# Patient Record
Sex: Female | Born: 1970 | Race: Black or African American | Hispanic: No | Marital: Single
Health system: Southern US, Community
[De-identification: ages and names within clinical notes are randomized; demographics above are authoritative.]

## PROBLEM LIST (undated history)

## (undated) HISTORY — PX: CEREBRAL ANEURYSM REPAIR: SHX164

---

## 1998-07-19 ENCOUNTER — Ambulatory Visit (HOSPITAL_COMMUNITY): Admission: RE | Admit: 1998-07-19 | Discharge: 1998-07-19 | Payer: Self-pay | Admitting: Unknown Physician Specialty

## 1998-07-21 ENCOUNTER — Ambulatory Visit (HOSPITAL_COMMUNITY): Admission: RE | Admit: 1998-07-21 | Discharge: 1998-07-21 | Payer: Self-pay | Admitting: Unknown Physician Specialty

## 1998-08-25 ENCOUNTER — Emergency Department (HOSPITAL_COMMUNITY): Admission: EM | Admit: 1998-08-25 | Discharge: 1998-08-25 | Payer: Self-pay | Admitting: Emergency Medicine

## 1999-10-18 ENCOUNTER — Emergency Department (HOSPITAL_COMMUNITY): Admission: EM | Admit: 1999-10-18 | Discharge: 1999-10-18 | Payer: Self-pay | Admitting: Emergency Medicine

## 1999-10-25 ENCOUNTER — Emergency Department (HOSPITAL_COMMUNITY): Admission: EM | Admit: 1999-10-25 | Discharge: 1999-10-25 | Payer: Self-pay

## 1999-10-26 ENCOUNTER — Emergency Department (HOSPITAL_COMMUNITY): Admission: EM | Admit: 1999-10-26 | Discharge: 1999-10-26 | Payer: Self-pay | Admitting: Emergency Medicine

## 1999-10-29 ENCOUNTER — Inpatient Hospital Stay (HOSPITAL_COMMUNITY): Admission: EM | Admit: 1999-10-29 | Discharge: 1999-10-31 | Payer: Self-pay | Admitting: Emergency Medicine

## 1999-10-30 ENCOUNTER — Encounter: Payer: Self-pay | Admitting: Neurology

## 1999-11-04 ENCOUNTER — Encounter: Payer: Self-pay | Admitting: Emergency Medicine

## 1999-11-04 ENCOUNTER — Inpatient Hospital Stay (HOSPITAL_COMMUNITY): Admission: EM | Admit: 1999-11-04 | Discharge: 1999-11-21 | Payer: Self-pay | Admitting: Neurosurgery

## 1999-11-04 ENCOUNTER — Encounter: Payer: Self-pay | Admitting: Neurosurgery

## 1999-11-05 ENCOUNTER — Encounter: Payer: Self-pay | Admitting: Neurosurgery

## 1999-11-07 ENCOUNTER — Encounter: Payer: Self-pay | Admitting: Neurological Surgery

## 1999-11-09 ENCOUNTER — Encounter: Payer: Self-pay | Admitting: Neurological Surgery

## 1999-11-18 ENCOUNTER — Encounter: Payer: Self-pay | Admitting: Neurosurgery

## 2000-02-05 ENCOUNTER — Encounter: Admission: RE | Admit: 2000-02-05 | Discharge: 2000-02-05 | Payer: Self-pay | Admitting: Neurosurgery

## 2000-02-05 ENCOUNTER — Encounter: Payer: Self-pay | Admitting: Neurosurgery

## 2000-10-17 ENCOUNTER — Emergency Department (HOSPITAL_COMMUNITY): Admission: EM | Admit: 2000-10-17 | Discharge: 2000-10-17 | Payer: Self-pay | Admitting: *Deleted

## 2000-10-17 ENCOUNTER — Encounter: Payer: Self-pay | Admitting: *Deleted

## 2001-04-24 ENCOUNTER — Emergency Department (HOSPITAL_COMMUNITY): Admission: EM | Admit: 2001-04-24 | Discharge: 2001-04-24 | Payer: Self-pay

## 2001-04-24 ENCOUNTER — Encounter: Payer: Self-pay | Admitting: Emergency Medicine

## 2003-05-31 ENCOUNTER — Emergency Department (HOSPITAL_COMMUNITY): Admission: EM | Admit: 2003-05-31 | Discharge: 2003-05-31 | Payer: Self-pay | Admitting: *Deleted

## 2003-06-04 ENCOUNTER — Encounter: Admission: RE | Admit: 2003-06-04 | Discharge: 2003-06-04 | Payer: Self-pay | Admitting: General Practice

## 2003-06-04 ENCOUNTER — Encounter: Payer: Self-pay | Admitting: General Practice

## 2005-05-04 ENCOUNTER — Emergency Department (HOSPITAL_COMMUNITY): Admission: EM | Admit: 2005-05-04 | Discharge: 2005-05-04 | Payer: Self-pay | Admitting: Emergency Medicine

## 2006-02-08 ENCOUNTER — Emergency Department (HOSPITAL_COMMUNITY): Admission: EM | Admit: 2006-02-08 | Discharge: 2006-02-09 | Payer: Self-pay | Admitting: Emergency Medicine

## 2008-02-24 ENCOUNTER — Emergency Department (HOSPITAL_COMMUNITY): Admission: EM | Admit: 2008-02-24 | Discharge: 2008-02-24 | Payer: Self-pay | Admitting: Emergency Medicine

## 2009-08-29 IMAGING — CT CT HEAD W/O CM
1 series · 16 of 30 positions shown, 20 images · non-contrast
Comparison: None available

CLINICAL DATA: Extreme dizziness.  History of subarachnoid
hemorrhage with aneurysm clipping.

CT HEAD WITHOUT CONTRAST
TECHNIQUE: Contiguous axial images were obtained from the base of
the skull through the vertex without contrast.

[Series 2: headseq 4.8 h45s · axial · 0.43mm/px · z∈[-201,-68]mm · 16 of 30 slices shown, 20 images]
[im 2/30  brain]
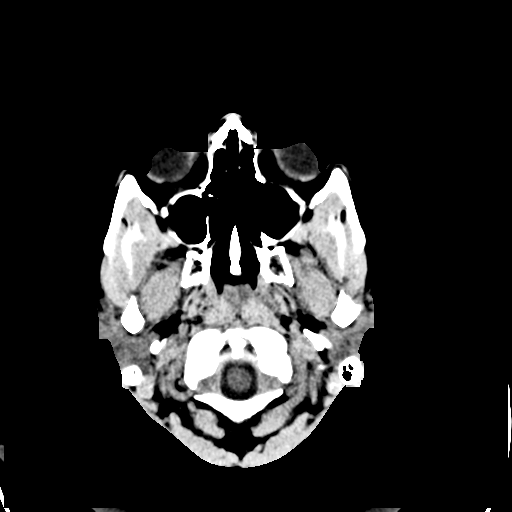
[im 2/30  bone]
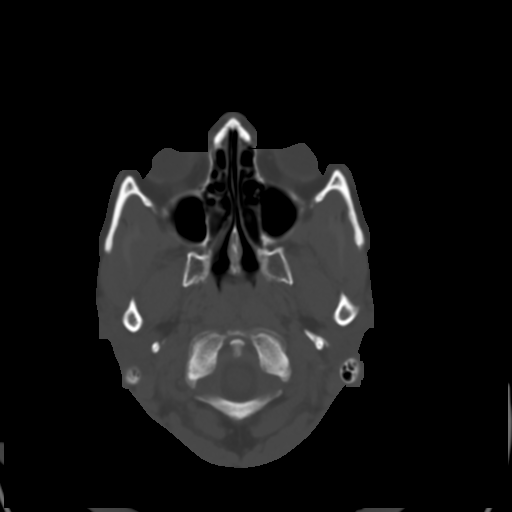
[im 4/30  brain]
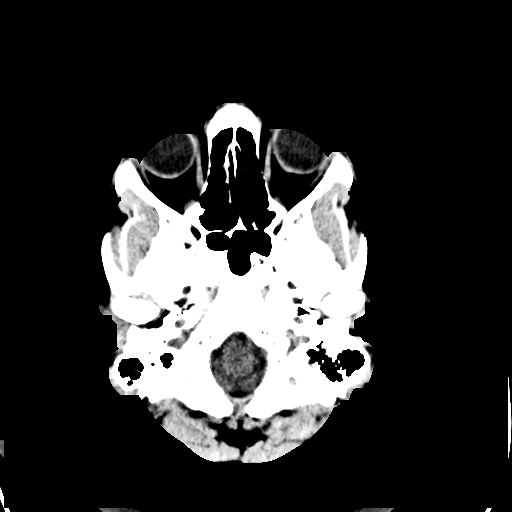
[im 6/30  brain]
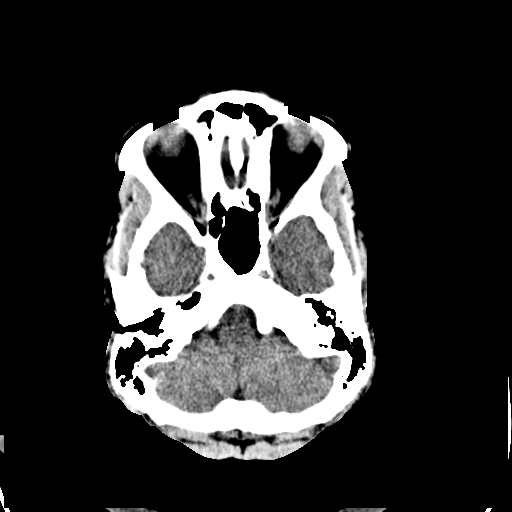
[im 8/30  brain]
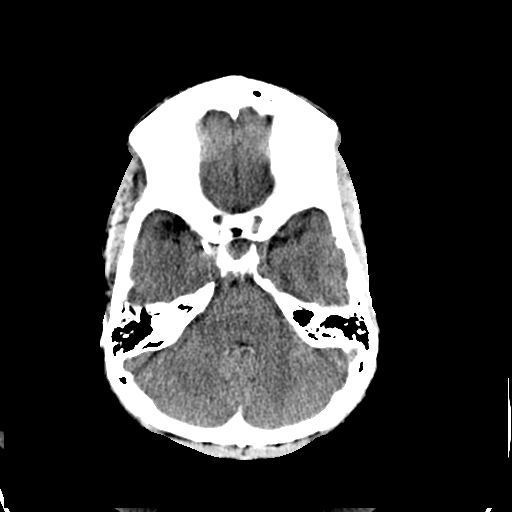
[im 9/30  brain]
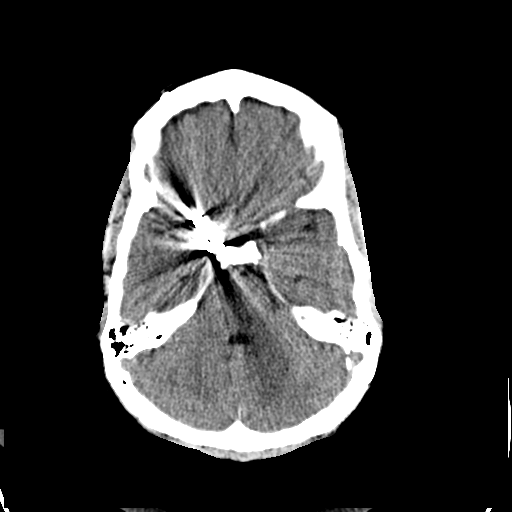
[im 9/30  bone]
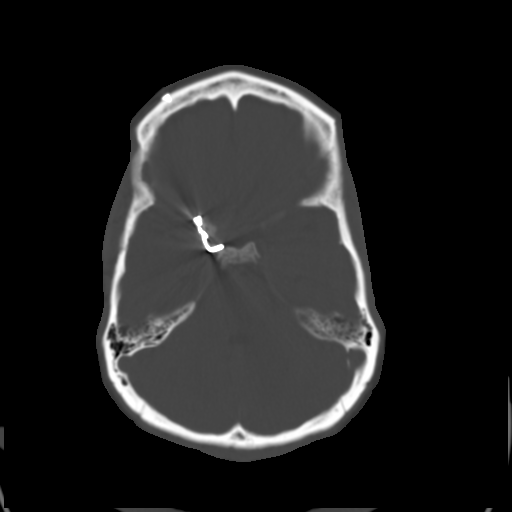
[im 11/30  brain]
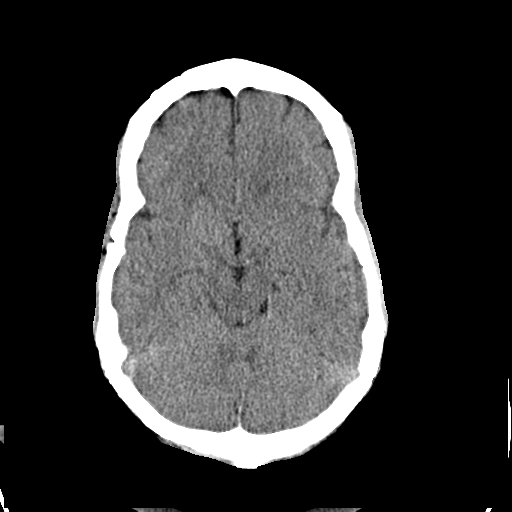
[im 13/30  brain]
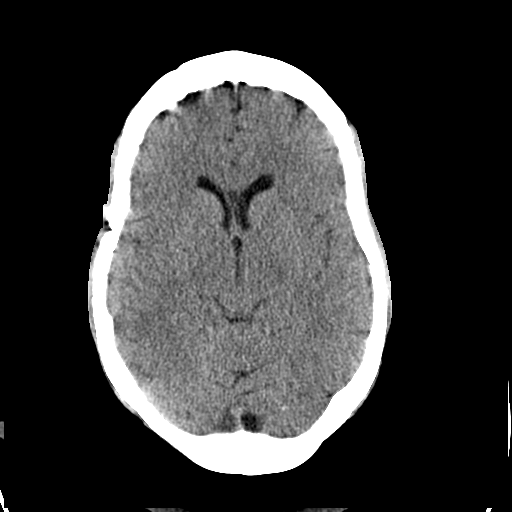
[im 15/30  brain]
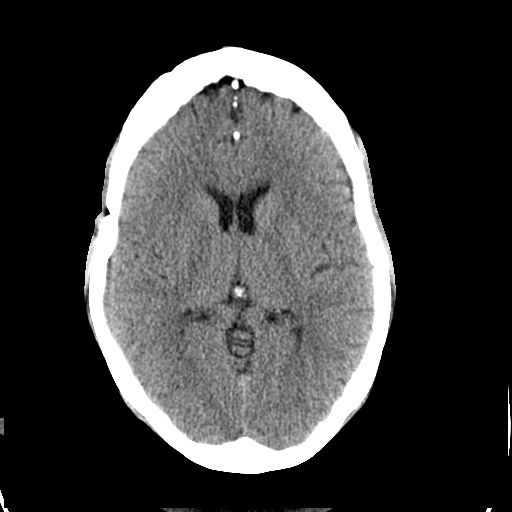
[im 16/30  brain]
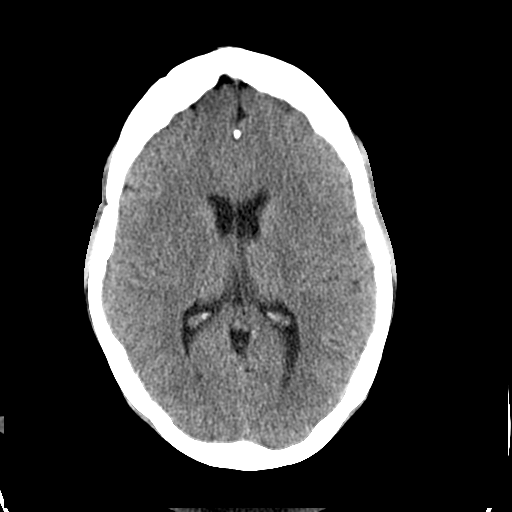
[im 16/30  bone]
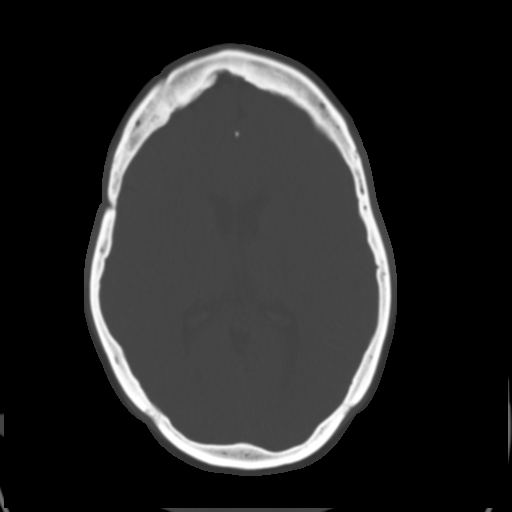
[im 18/30  brain]
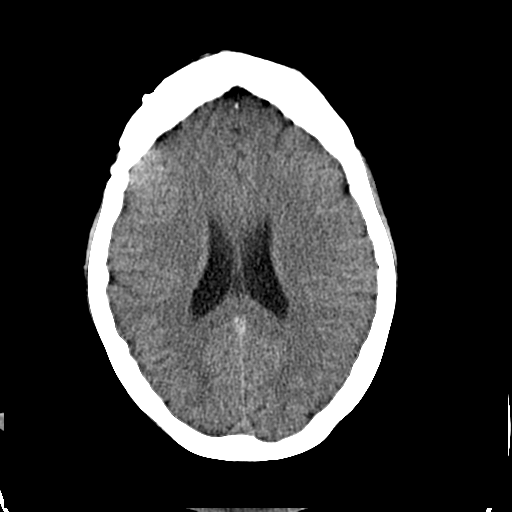
[im 20/30  brain]
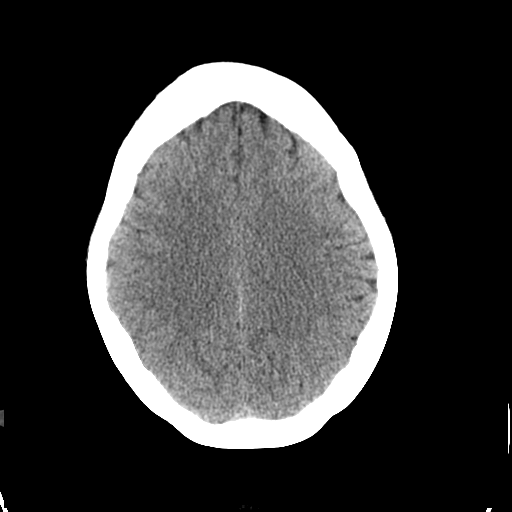
[im 22/30  brain]
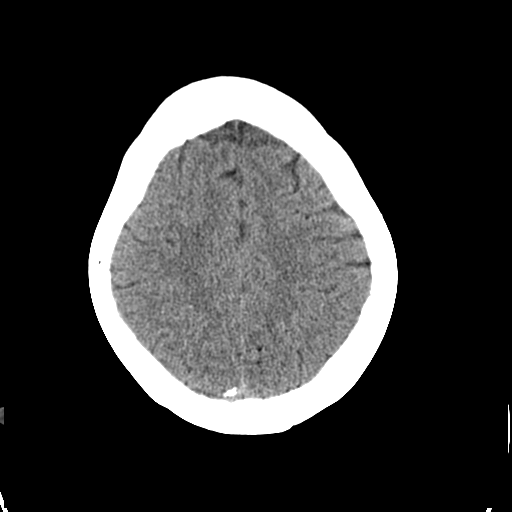
[im 23/30  brain]
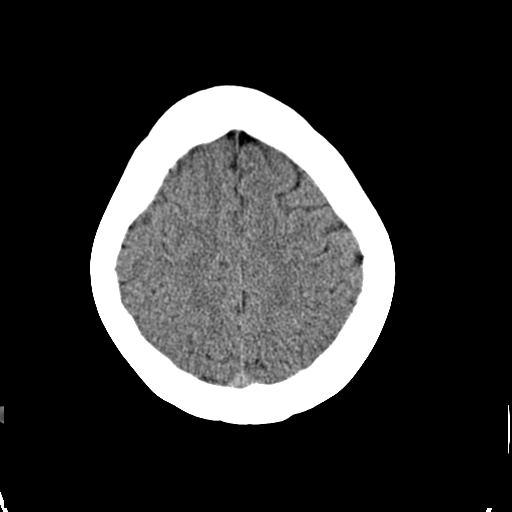
[im 23/30  bone]
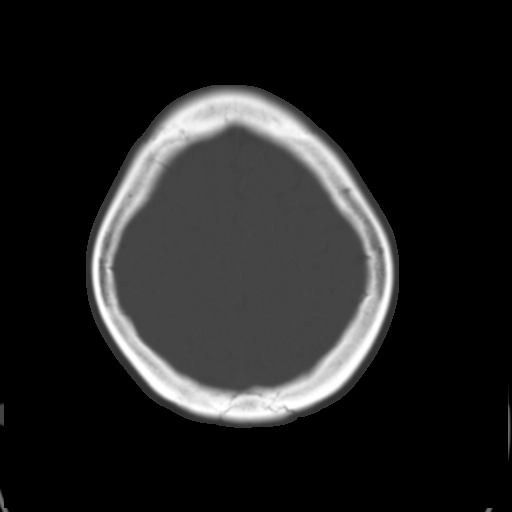
[im 25/30  brain]
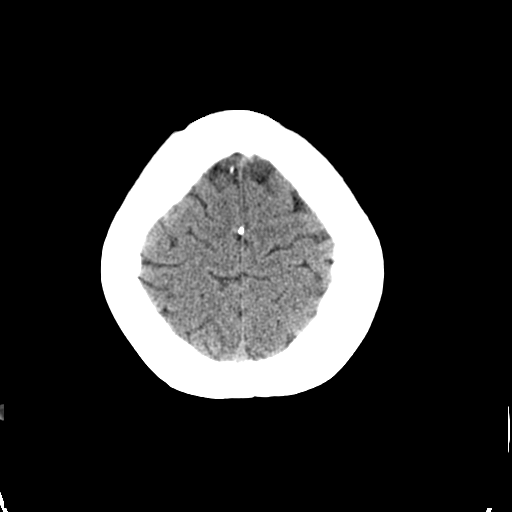
[im 27/30  brain]
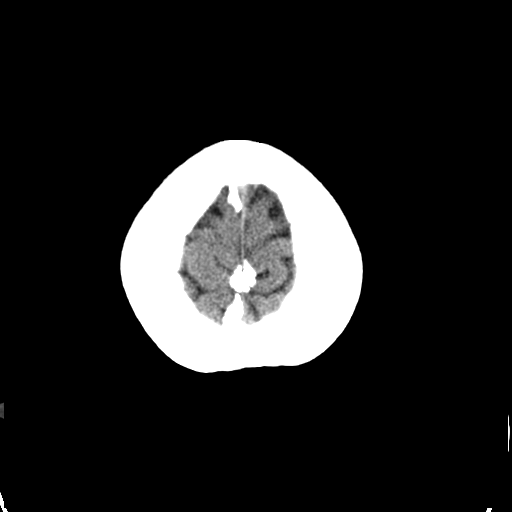
[im 29/30  brain]
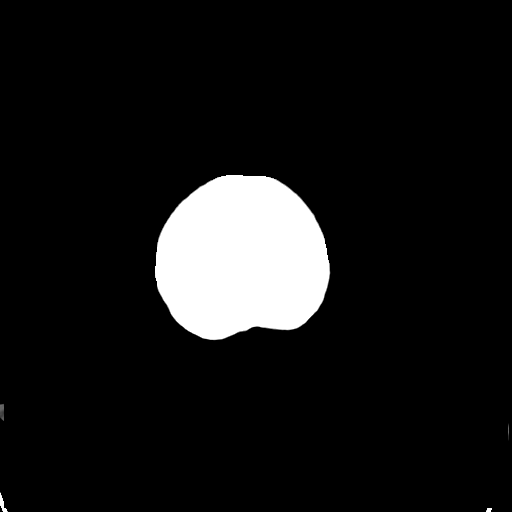

[16 of 30 positions shown; findings below may reference images not displayed]

FINDINGS: There is no evidence for acute hemorrhage, hydrocephalus,
mass lesion, or abnormal extra-axial fluid collection.  No definite
CT evidence for acute infarct.  Aneurysm clip seen in the right MCA
region.  Visualized portions of the paranasal sinuses and mastoid
air cells are clear.  Right fronto temporal craniotomy defect is
evident.
IMPRESSION: Postsurgical change on the right.  No acute intracranial
abnormality.

## 2011-02-26 NOTE — H&P (Signed)
[place]  Patient:    Gina Hunter                         MRN: [id number] Adm. Date:  [id number] Attending:  Josie Saunders CC:         Marlan Palau, M.D.                         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  HISTORY OF ILLNESS:  Gina Hunter is a 40 year old woman who has been having intermittent severe headaches since October 13, 1999, who was treated for migraines by Dr. Marlan Palau after initial head CTs were negative at [facility] x 2 and she had an MRI of her brain which was also negative. The patient subsequently developed severe headache with nausea, vomiting and enlarged right pupil and presented to [facility] on November 04, 1999 where a head CT demonstrated right-sided subarachnoid hemorrhage with incipient hydrocephalus.  The patient was found to have right third nerve palsy on examination.  She was seen by Dr. Anne Hahn, who called me; the patient was subsequently transferred to [facility] for further management.  An angiogram was done on an emergent basis at [facility] which shows a large right posterior communicating artery aneurysm with mild vasospasm.  Head CT showed incipient hydrocephalus.  PHYSICAL EXAMINATION:  NEUROLOGIC:  On initial examination in [facility], the patient was somnolent but arousable.  She speaks with a very weak voice.  She has a complete right third nerve palsy with ptosis, large pupil, no abduction and decreased up-gaze and down-gaze.  Her left pupil is reactive.  Extraocular movements are intact on the left.  She was stable to state her name and that she was in the hospital.  She complained of a headache.  She had meningismus.  She moved all extremities to command.  ASSESSMENT AND PLAN:  The patient was taken  following angiography for emergent craniotomy, clipping of aneurysm and placement of right intraventricular catheter. The patient was informed of the situation as well as the attendant risks of surgery which include vasospasm, hydrocephalus, stroke, death, need for shunt or intraoperative rupture of the aneurysm.  PAST MEDICAL HISTORY:  Essentially unremarkable other than headaches.  SOCIAL HISTORY:  Patient works as a Geophysicist/field seismologist.  She has a 12-year-old daughter.  ALLERGIES:  She is intolerant of CODEINE, which causes nausea. DD:  11/05/99 TD:  11/05/99 Job: [id number]

## 2011-02-26 NOTE — Discharge Summary (Signed)
[place] [place]  Patient:    Gina Hunter, Gina Hunter                        MRN: [id number] Adm. Date:  [id number] Disc. Date: [id number] Attending:  Josie Saunders Dictator:   Cristi Loron, M.D.                           Discharge Summary  HISTORY OF PRESENT ILLNESS:  For full details of this admission please refer to the transcribed History and Physical.  Briefly, the patients is a 40 year old [facility] female who presented with a complete right third nerve palsy and new onset subarachnoid hemorrhage.  She had angiogram which demonstrated a very large right posterior communicating artery aneurysm.  Surgery was recommended and the patient weighed the risks and benefits and alternatives of surgery and decided to proceed with the operation.  For past medical history, past surgical history, medications prior to admission, drug allergies, family medical history, social history, admission physical examination, imaging studies, assessment and plan, etc., please refer to the transcribed History and Physical.  HOSPITAL COURSE:  Dr. Venetia Maxon admitted the patient to [facility] on November 04, 1999 with a diagnosis of right posterior communicating artery aneurysm, subarachnoid hemorrhage, right third nerve palsy, and obstructive hydrocephalus. The day of admission Dr. Venetia Maxon took her to the operating room and performed placement of a right frontal interventricular catheter and a right pterional craniotomy for clipping of posterior communicating artery aneurysm using microdissection.  The surgery went well without complications.  (For full details of this operation please refer to the transcribed operative note).  The patients postoperative course was as follows.  She continued to have a right cranial third nerve palsy as expected but did well postoperatively.  Her ventricular drain continued for several days.  She had a repeat cranial CT  scan on November 07, 1999 which demonstrated some edema in the frontal lobe; i.e., expected postoperative changes.  The patient had her ventricular drain elevated on November 08, 1999.  She did have transcranial Dopplers performed which did elevate over he postoperative period with a maximum velocity of 208 in the right MCA on November 16, 1999.  This had improved by November 17, 1999 to 201.  The patient remained  clinically asymptomatic from this but she was treated with [facility] therapy for this. The patients ventricular drain was discontinued on November 18, 1999.  The patients vasopressors and HHH therapy were discontinued on November 18, 1999. he patient did have some abdominal pain on November 19, 1999 which was attributed o gas.  This resolved by November 20, 1999.  Dr. Venetia Maxon wanted to continue to observe the patient through November 21, 1999.  At this time she was afebrile with stable vital signs and she was eating well, ambulating well, and her wound was healing  well without sign of infection.  She continued to have right third nerve palsy ut she had no clinical signs of vasospasm and she was requesting discharge home.  discussed with her the use of amlodipine.  Her TCs are on the way down and she s not clinically symptomatic with her vasospasm and therefore we decided to not continue amlodipine at this point as she is over 16 days status post her subarachnoid hemorrhage.  The patient requested discharge home on November 21, 1999 and as she was stable at that point I discharged her.  DISCHARGE INSTRUCTIONS:  The patient was given written discharge instructions and instructed to follow up with Dr. Venetia Maxon in one week.  DISCHARGE MEDICATIONS: 1.  Percocet 5 (#50) 1 or 2 p.o. q.6h. p.r.n. pain (no refill). 2.  Dilantin 100 mg 1 p.o. t.i.d. (no refill).  FINAL DIAGNOSIS: 1.  Subarachnoid hemorrhage. 2.  Right posterior communicating artery aneurysm. 3.  Right  oculomotor nerve palsy. 4.  Cerebral vasospasm. 5.  Hydrocephalus.  PROCEDURE: 1.  Right pterional craniotomy for clipping of right posterior communicating     artery segment aneurysm and placement of ventriculostomy. DD:  11/21/99 TD:  11/23/99 Job: [id number]

## 2011-02-26 NOTE — Op Note (Signed)
. [facility]  Patient:    Gina Hunter                         MRN: [id number] Proc. Date: 11/04/99 Adm. Date:  [id number] Attending:  Josie Saunders                           Operative Report  PREOPERATIVE DIAGNOSIS: 1. Right posterior communicating artery aneurysm. 2. Subarachnoid hemorrhage. 3. Right third nerve palsy. 4. Obstructive hydrocephalus.  POSTOPERATIVE DIAGNOSIS: 1. Right posterior communicating artery aneurysm. 2. Subarachnoid hemorrhage. 3. Right third nerve palsy. 4. Obstructive hydrocephalus.  OPERATION PERFORMED: 1. Placement of right frontal interventricular catheter. 2. Right pterional craniotomy and clipping of posterior communicating artery  aneurysm. 3. Microdissection using microscope and microdissection technique.  SURGEON:  Danae Orleans. Venetia Maxon, M.D.  ASSISTANTMarland Kitchen  Mena Goes. Franky Macho, M.D.  ANESTHESIA:  General endotracheal.  ESTIMATED BLOOD LOSS:  300 cc.  COMPLICATIONS:  None.  DISPOSITION:  To recovery.  INDICATIONS FOR PROCEDURE:  Gina Hunter is a 40 year old woman with a complete right third nerve palsy and new onset subarachnoid hemorrhage.  She had an angiogram which demonstrates a very large right posterior communicating artery aneurysm.  It was elected to take her to surgery for craniotomy and clipping of  aneurysm.  She had evidence of incipient hydrocephalus with temporal horns on head CT.  DESCRIPTION OF PROCEDURE:  The patient was brought to the operating room. Following satisfactory and uncomplicated induction of general endotracheal anesthesia, placement of intravenous lines and central line by anesthesia, she as placed in a supine position.  Her head was placed in three-pin Mayfield head fixation.  She was placed at 30 degrees out from vertical with malar eminence up. Initially, her right frontal temporoparietal scalp was shaved and prepped in the usual sterile fashion and a right  frontoventricular catheter was placed through a separate stab incision using a twist drill.  There was good return of CSF, which was bloody and under pressure.  The ventricular catheter was not allowed to drain a great deal initially.  This was tunneled through a separate exit, anchored with a 3-0 nylon stitch.  The incision was also closed with nylon suture.  The catheter was then hooked to a ____________ and a sterile occlusive dressing was place. he patients right pterional scalp was then prepped with DuraPrep and redraped.  A right pterional craniotomy incision was made after infiltrating the galea with .5% lidocaine.  0.25% Marcaine with epinephrine 1:200,000.  Incision was made through the galea and to the temporalis muscle fascia.  Raney clips were applied.  The temporalis muscle fascia was then incised and the temporalis muscle was incised  with electrocautery.  The scalp flap and muscle were brought forward and retracted with towel clips.  Using the Midas Rex drill and the AM3 bur, a bur hole was placed at the key hole and an additional bur hole was placed in the temporal squama and a final bur hole was placed over at the superotemporal line posteriorly.  A pterional bone flap was then cut and elevated.  The temporal squama was taken down and the sphenoid wing was taken down using the [place] rongeur and [employer].  This  bone was then further thinned using Midas Rex drill and M3 bur.  Hemostasis was  obtained with bone wax.  The buddy halo was then placed.  The microscope was draped.  The dura was opened in a curvilinear fashion based about the sphenoid ing and prior to doing so, the ventricular catheter was drained for a total of 40 cc of CSF with good brain relaxation.  Using a 3/8 retractor blade, the olfactory nerve and optic nerve were identified on the right using a frontal retractor.  The microscope was then brought into the field and the remainder of  the dissection as performed using microdissection technique under high magnification.  After opening the optic cistern, the sylvian fissure was split and the carotid artery was identified.  Initially, the medial wall of the carotid artery was identified and dissected free of investing arachnoid.  There was significant blood around the carotid artery.  There was also significant subarachnoid blood.  The carotid artery was then dissected to the carotid bifurcation and then subsequently dissection as performed along the lateral wall of the carotid artery exposing the aneurysm. Following sharp dissection of arachnoid tethering the aneurysm to the temporal lobe, a temporal lobe retractor was placed.  The aneurysm was then further dissected and was found to be extremely large and directly beneath the internal  carotid artery.  The posterior communicating artery was identified but was found to be quite adherent to the wall of the aneurysm and it was felt that given the large size of the aneurysm and its deep location that dissection of the posterior communicating artery from the aneurysm would not be possible.  Subsequently it as elected to place a fenestrated right angled aneurysm clip over the carotid artery and to occlude the neck of the aneurysm.  Upon doing so, the aneurysm was ruptured using tuberculin syringe and a 23 gauge butterfly.  There was very slow leakage of blood from the hole created in the aneurysm and on further inspection, it was felt that the aneurysm clip was probably too high on the carotid artery and was leaving the neck partially patent.  It was therefore elected to reposition the clip by bringing it down lower onto the neck of the aneurysm.  After doing so, there was no evidence of any bleeding from the previously punctured aneurysm and it was felt  that the aneurysm had been obliterated.  The microdoppler was used to confirm patency of the carotid artery  and it was not felt to be narrowed by clip placement. There was excellent flow through the carotid artery.  The artery was then bathed in papavarine which was then removed after bathing the carotid artery.  The retractor  blades were then removed.  There was excellent hemostasis.  The wound was copiously irrigated with saline.  The dura was then closed with interrupted Nurulon sutures. Dural tack-up sutures were placed including a central dural tack-up stitch. The bone flap was replaced using Osteomed plating system.  The temporalis fascia was then reapproximated with 2-0 Vicryl interrupted sutures.  The galea was reapproximated with 2-0 Vicryl interrupted inverted sutures.  Skin edges reapproximated with running 3-0 nylon locked stitch.  The wound was dressed with bacitracin, Telfa, gauze and a Kerlix and Kling head dressing was applied.  The  patient was then extubated in the operating room after removing her from pins. She appeared to tolerate the surgical procedure without any difficulty.  Counts were correct at the end of the case. DD:  11/04/99 TD:  11/05/99 Job: [id number]

## 2017-10-30 ENCOUNTER — Encounter (HOSPITAL_COMMUNITY): Payer: Self-pay | Admitting: Emergency Medicine

## 2017-10-30 ENCOUNTER — Emergency Department (HOSPITAL_COMMUNITY)
Admission: EM | Admit: 2017-10-30 | Discharge: 2017-10-30 | Disposition: A | Discharge status: Home / Self Care | Payer: BC Managed Care – PPO | Attending: Emergency Medicine | Admitting: Emergency Medicine

## 2017-10-30 DIAGNOSIS — J069 Acute upper respiratory infection, unspecified: Secondary | ICD-10-CM | POA: Insufficient documentation

## 2017-10-30 DIAGNOSIS — B9789 Other viral agents as the cause of diseases classified elsewhere: Secondary | ICD-10-CM | POA: Diagnosis not present

## 2017-10-30 DIAGNOSIS — R05 Cough: Secondary | ICD-10-CM | POA: Diagnosis present

## 2017-10-30 MED ORDER — PROMETHAZINE-DM 6.25-15 MG/5ML PO SYRP: 5.0000 mL | ORAL_SOLUTION | Freq: Four times a day (QID) | ORAL | 0 refills | Status: AC | PRN

## 2017-10-30 MED ORDER — PREDNISONE 10 MG PO TABS: 20.0000 mg | ORAL_TABLET | Freq: Every day | ORAL | 0 refills | Status: AC

## 2017-10-30 MED ORDER — DEXAMETHASONE SODIUM PHOSPHATE 10 MG/ML IJ SOLN
10.0000 mg | Freq: Once | INTRAMUSCULAR | Status: AC
  Administered 2017-10-30: 10 mg via INTRAMUSCULAR
  Filled 2017-10-30: qty 1

## 2017-10-30 MED ORDER — ONDANSETRON 4 MG PO TBDP
4.0000 mg | ORAL_TABLET | Freq: Once | ORAL | Status: AC
  Administered 2017-10-30: 4 mg via ORAL
  Filled 2017-10-30: qty 1

## 2017-10-30 MED ORDER — PROMETHAZINE HCL 25 MG PO TABS: 25.0000 mg | ORAL_TABLET | Freq: Three times a day (TID) | ORAL | 0 refills | Status: AC | PRN

## 2017-10-30 MED ORDER — FLUTICASONE PROPIONATE 50 MCG/ACT NA SUSP: 2.0000 | Freq: Every day | NASAL | 0 refills | Status: AC

## 2017-10-30 MED ORDER — BENZONATATE 100 MG PO CAPS: 100.0000 mg | ORAL_CAPSULE | Freq: Three times a day (TID) | ORAL | 0 refills | Status: AC

## 2017-10-30 NOTE — ED Provider Notes (Signed)
[facility]-EMERGENCY DEPT Provider Note   CSN: [id number] Arrival date & time: 10/30/17  1217     History   Chief Complaint Chief Complaint  Patient presents with  . Cough    HPI Gina Hunter is a 47 y.o. female presenting for evaluation of cough, fever, and body aches.  Patient states for the past 3 days, she has had intermittent fever, cough, and body aches.  She has associated nausea and vomiting.  She is concerned she might have pneumonia, the flu, or a respiratory infection.  She has been trying TheraFlu and other over-the-counter medications without improvement of her symptoms.  She denies ear pain, eye pain, nasal congestion, sore throat, chest pain, difficulty breathing, or abdominal pain.  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  She has no other medical problems, does not take medications daily.  She did not receive a flu shot this year.   HPI  History reviewed. No pertinent past medical history.  There are no active problems to display for this patient.   Past Surgical History:  Procedure Laterality Date  . CEREBRAL ANEURYSM REPAIR      OB History    No data available       Home Medications    Prior to Admission medications   Medication Sig Start Date End Date Taking? Authorizing Provider  benzonatate (TESSALON) 100 MG capsule Take 1 capsule (100 mg total) by mouth every 8 (eight) hours. 10/30/17   Gwen Edler, PA-C  fluticasone (FLONASE) 50 MCG/ACT nasal spray Place 2 sprays into both nostrils daily. 10/30/17   Saman Giddens, PA-C  predniSONE (DELTASONE) 10 MG tablet Take 2 tablets (20 mg total) by mouth daily for 4 days. 10/30/17 11/03/17  Dwon Sky, PA-C  promethazine (PHENERGAN) 25 MG tablet Take 1 tablet (25 mg total) by mouth every 8 (eight) hours as needed for nausea or vomiting. 10/30/17   Rhema Boyett, PA-C  promethazine-dextromethorphan (PROMETHAZINE-DM) 6.25-15 MG/5ML syrup  Take 5 mLs by mouth 4 (four) times daily as needed for cough. 10/30/17   Saragrace Selke, PA-C    Family History No family history on file.  Social History Social History   Tobacco Use  . Smoking status: Not on file  Substance Use Topics  . Alcohol use: Not on file  . Drug use: Not on file     Allergies   Codeine   Review of Systems Review of Systems  Constitutional: Positive for fever.  HENT: Negative for congestion and sore throat.   Respiratory: Positive for cough. Negative for chest tightness and shortness of breath.   Cardiovascular: Negative for chest pain.  Gastrointestinal: Positive for nausea and vomiting. Negative for abdominal pain.  Musculoskeletal: Positive for myalgias.     Physical Exam Updated Vital Signs BP 95/80 (BP Location: Left Arm)   Pulse 76   Temp 99.8 F (37.7 C) (Oral)   Resp 14   LMP 10/24/2017   SpO2 96%   Physical Exam  Constitutional: She is oriented to person, place, and time. She appears well-developed and well-nourished. No distress.  HENT:  Head: Normocephalic and atraumatic.  Right Ear: Tympanic membrane, external ear and ear canal normal.  Left Ear: Tympanic membrane, external ear and ear canal normal.  Nose: Mucosal edema present. Right sinus exhibits no maxillary sinus tenderness and no frontal sinus tenderness. Left sinus exhibits no maxillary sinus tenderness and no frontal sinus tenderness.  Mouth/Throat: Uvula is midline, oropharynx is clear and moist  and mucous membranes are normal. No tonsillar exudate.  Nasal mucosal edema.  OP erythematous with mild cobblestoning.  No tonsillar swelling or exudate.  Uvula midline with equal palate rise.  No trismus or muffled voice.  TMs not erythematous and not bulging bilaterally.  Eyes: Conjunctivae and EOM are normal. Pupils are equal, round, and reactive to light.  Neck: Normal range of motion.  Cardiovascular: Normal rate, regular rhythm and intact distal pulses.    Pulmonary/Chest: Effort normal and breath sounds normal. She has no decreased breath sounds. She has no wheezes. She has no rhonchi. She has no rales.  Pt speaking in full sentences without difficulty.  Clear lung sounds in all fields.  Abdominal: Soft. She exhibits no distension and no mass. There is no tenderness. There is no guarding.  Musculoskeletal: Normal range of motion.  Lymphadenopathy:    She has no cervical adenopathy.  Neurological: She is alert and oriented to person, place, and time.  Skin: Skin is warm.  Psychiatric: She has a normal mood and affect.  Nursing note and vitals reviewed.    ED Treatments / Results  Labs (all labs ordered are listed, but only abnormal results are displayed) Labs Reviewed - No data to display  EKG  EKG Interpretation None       Radiology No results found.  Procedures Procedures (including critical care time)  Medications Ordered in ED Medications  ondansetron (ZOFRAN-ODT) disintegrating tablet 4 mg (4 mg Oral Given 10/30/17 1532)  dexamethasone (DECADRON) injection 10 mg (10 mg Intramuscular Given 10/30/17 1558)     Initial Impression / Assessment and Plan / ED Course  I have reviewed the triage vital signs and the nursing notes.  Pertinent labs & imaging results that were available during my care of the patient were reviewed by me and considered in my medical decision making (see chart for details).     Patient presenting with 3 day h/o URI symptoms.  Physical exam reassuring, patient is afebrile and appears nontoxic.  Pulmonary exam reassuring.  Doubt pneumonia, strep, other bacterial infection, or peritonsillar abscess.  Likely viral URI.  Pt given zofran and PO challenge.   On reassessment, pt reports nausea is improved and she feels ready to go home.  Will treat symptomatically.  Patient to follow-up with primary care as needed.  At this time, patient appears safe for discharge.  Return precautions given.  Patient states  they understand and agrees to plan.   Final Clinical Impressions(s) / ED Diagnoses   Final diagnoses:  Viral URI with cough    ED Discharge Orders        Ordered    promethazine (PHENERGAN) 25 MG tablet  Every 8 hours PRN     10/30/17 1613    predniSONE (DELTASONE) 10 MG tablet  Daily     10/30/17 1613    fluticasone (FLONASE) 50 MCG/ACT nasal spray  Daily     10/30/17 1613    benzonatate (TESSALON) 100 MG capsule  Every 8 hours     10/30/17 1613    promethazine-dextromethorphan (PROMETHAZINE-DM) 6.25-15 MG/5ML syrup  4 times daily PRN     10/30/17 1613       Keyana Guevara, PA-C 10/30/17 2215    Maia PlanLong, Joshua G, MD 10/31/17 1105

## 2017-10-30 NOTE — Discharge Instructions (Signed)
You likely have a viral illness.  This should be treated symptomatically. Use Tylenol or ibuprofen as needed for fevers or body aches. Use Flonase daily for nasal congestion and cough. Take prednisone as prescribed.  Use tessalon pereles and cough syrup as needed for cough.  Use phenergan as needed for nausea or vomiting.  Make sure you stay well-hydrated with water. Wash your hands frequently to prevent spread of infection. Follow-up with your primary care doctor in 1 week if your symptoms are not improving. Return to the emergency room if you develop chest pain, difficulty breathing, or any new or worsening symptoms.

## 2017-10-30 NOTE — ED Triage Notes (Signed)
Patient c/o cough, congestion, body aches, and fever x3 days. Reports decreased appetite.

## 2019-12-10 ENCOUNTER — Ambulatory Visit: Payer: BC Managed Care – PPO | Attending: Internal Medicine

## 2019-12-10 ENCOUNTER — Other Ambulatory Visit: Payer: Self-pay

## 2019-12-10 DIAGNOSIS — Z23 Encounter for immunization: Secondary | ICD-10-CM | POA: Insufficient documentation

## 2019-12-10 NOTE — Progress Notes (Signed)
   Covid-19 Vaccination Clinic  Name:  MAGDELENA KINSELLA    MRN: [id number] DOB: [birth year]  12/10/2019  Ms. Gribble was observed post Covid-19 immunization for 15 minutes without incidence. She was provided with Vaccine Information Sheet and instruction to access the V-Safe system.   Ms. Heying was instructed to call 911 with any severe reactions post vaccine: Marland Kitchen Difficulty breathing  . Swelling of your face and throat  . A fast heartbeat  . A bad rash all over your body  . Dizziness and weakness    Immunizations Administered    Name Date Dose VIS Date Route   Pfizer COVID-19 Vaccine 12/10/2019  4:40 PM 0.3 mL 09/21/2019 Intramuscular   Manufacturer: [employer], [employer]   Lot: [id number]   NDC: [address]-7

## 2020-01-02 ENCOUNTER — Ambulatory Visit: Payer: BC Managed Care – PPO | Attending: Internal Medicine

## 2020-01-02 DIAGNOSIS — Z23 Encounter for immunization: Secondary | ICD-10-CM

## 2020-01-02 NOTE — Progress Notes (Signed)
   Covid-19 Vaccination Clinic  Name:  Gina Hunter    MRN: [id number] DOB: 1971-06-18  01/02/2020  Gina Hunter was observed post Covid-19 immunization for 15 minutes without incident. She was provided with Vaccine Information Sheet and instruction to access the V-Safe system.   Gina Hunter was instructed to call 911 with any severe reactions post vaccine: Marland Kitchen Difficulty breathing  . Swelling of face and throat  . A fast heartbeat  . A bad rash all over body  . Dizziness and weakness   Immunizations Administered    Name Date Dose VIS Date Route   Pfizer COVID-19 Vaccine 01/02/2020 11:09 AM 0.3 mL 09/21/2019 Intramuscular   Manufacturer: [employer], [employer]   Lot: [id number]   NDC: [address]-5

## 2023-11-28 ENCOUNTER — Telehealth: Payer: Self-pay | Admitting: Physician Assistant

## 2023-11-28 ENCOUNTER — Ambulatory Visit: Payer: Self-pay

## 2023-11-28 DIAGNOSIS — J069 Acute upper respiratory infection, unspecified: Secondary | ICD-10-CM

## 2023-11-28 NOTE — Progress Notes (Signed)
 Virtual Visit Consent   Gina Hunter, you are scheduled for a virtual visit with a [facility] provider today. Just as with appointments in the office, your consent must be obtained to participate. Your consent will be active for this visit and any virtual visit you may have with one of our providers in the next 365 days. If you have a MyChart account, a copy of this consent can be sent to you electronically.  As this is a virtual visit, video technology does not allow for your provider to perform a traditional examination. This may limit your provider's ability to fully assess your condition. If your provider identifies any concerns that need to be evaluated in person or the need to arrange testing (such as labs, EKG, etc.), we will make arrangements to do so. Although advances in technology are sophisticated, we cannot ensure that it will always work on either your end or our end. If the connection with a video visit is poor, the visit may have to be switched to a telephone visit. With either a video or telephone visit, we are not always able to ensure that we have a secure connection.  By engaging in this virtual visit, you consent to the provision of healthcare and authorize for your insurance to be billed (if applicable) for the services provided during this visit. Depending on your insurance coverage, you may receive a charge related to this service.  I need to obtain your verbal consent now. Are you willing to proceed with your visit today? Gina Hunter has provided verbal consent on 11/28/2023 for a virtual visit (video or telephone). Gina Hunter, [place]  Date: 11/28/2023 11:44 AM   Virtual Visit via Video Note   I, Gina Hunter, connected with  ASHLEI CHINCHILLA  ([id number], [birth year]) on 11/28/23 at 11:30 AM EST by a video-enabled telemedicine application and verified that I am speaking with the correct person using two identifiers.  Location: Patient: Virtual Visit Location Patient:  Home Provider: Virtual Visit Location Provider: Home Office   I discussed the limitations of evaluation and management by telemedicine and the availability of in person appointments. The patient expressed understanding and agreed to proceed.    History of Present Illness: Gina Hunter is a 53 y.o. who identifies as a female who was assigned female at birth, and is being seen today for uri .  HPI: URI  This is a new problem. The current episode started in the past 7 days. The problem has been resolved. There has been no fever. The fever has been present for 1 to 2 days. Associated symptoms include coughing and rhinorrhea. She has tried antihistamine, acetaminophen and NSAIDs for the symptoms. The treatment provided significant relief.    Problems: There are no active problems to display for this patient.   Allergies:  Allergies  Allergen Reactions   Codeine Nausea And Vomiting   Medications:  Current Outpatient Medications:    benzonatate (TESSALON) 100 MG capsule, Take 1 capsule (100 mg total) by mouth every 8 (eight) hours., Disp: 21 capsule, Rfl: 0   fluticasone (FLONASE) 50 MCG/ACT nasal spray, Place 2 sprays into both nostrils daily., Disp: 16 g, Rfl: 0   promethazine (PHENERGAN) 25 MG tablet, Take 1 tablet (25 mg total) by mouth every 8 (eight) hours as needed for nausea or vomiting., Disp: 15 tablet, Rfl: 0   promethazine-dextromethorphan (PROMETHAZINE-DM) 6.25-15 MG/5ML syrup, Take 5 mLs by mouth 4 (four) times daily as needed for cough., Disp: 118 mL,  Rfl: 0  Observations/Objective: Patient is well-developed, well-nourished in no acute distress.  Resting comfortably  at home.  Head is normocephalic, atraumatic.  No labored breathing.  Speech is clear and coherent with logical content.  Patient is alert and oriented at baseline.    Assessment and Plan: 1. Upper respiratory tract infection, unspecified type (Primary)  Patient presents with symptoms suspicious for likely  viral upper respiratory infection. Differentials include bacterial pneumonia, sinusitis, allergic rhinitis. Do not suspect underlying cardiopulmonary process. I considered, but think unlikely, dangerous causes of this patient's symptoms to include ACS, CHF or COPD exacerbations, pneumonia, pneumothorax. Patient is nontoxic appearing and not in need of emergent medical intervention. Counseled on monitoring temperature and should it reach 100.4 or greater she should present for in person evaluation.  Plan: reassurance, reassessment, over the counter medications, discharge with PCP follow-up  Follow Up Instructions: I discussed the assessment and treatment plan with the patient. The patient was provided an opportunity to ask questions and all were answered. The patient agreed with the plan and demonstrated an understanding of the instructions.  A copy of instructions were sent to the patient via MyChart unless otherwise noted below.     The patient was advised to call back or seek an in-person evaluation if the symptoms worsen or if the condition fails to improve as anticipated.    Gina Kidney, PA-C

## 2023-11-28 NOTE — Patient Instructions (Signed)
  Lucille T Whisnant, thank you for joining [name], PA-C for today's virtual visit.  While this provider is not your primary care provider (PCP), if your PCP is located in our provider database this encounter information will be shared with them immediately following your visit.   A [facility] MyChart account gives you access to today's visit and all your visits, tests, and labs performed at [facility] " click here if you don't have a [facility] MyChart account or go to [email or web address]  Consent: (Patient) Saryah T Lachman provided verbal consent for this virtual visit at the beginning of the encounter.  Current Medications:  Current Outpatient Medications:    benzonatate (TESSALON) 100 MG capsule, Take 1 capsule (100 mg total) by mouth every 8 (eight) hours., Disp: 21 capsule, Rfl: 0   fluticasone (FLONASE) 50 MCG/ACT nasal spray, Place 2 sprays into both nostrils daily., Disp: 16 g, Rfl: 0   promethazine (PHENERGAN) 25 MG tablet, Take 1 tablet (25 mg total) by mouth every 8 (eight) hours as needed for nausea or vomiting., Disp: 15 tablet, Rfl: 0   promethazine-dextromethorphan (PROMETHAZINE-DM) 6.25-15 MG/5ML syrup, Take 5 mLs by mouth 4 (four) times daily as needed for cough., Disp: 118 mL, Rfl: 0   Medications ordered in this encounter:  No orders of the defined types were placed in this encounter.    *If you need refills on other medications prior to your next appointment, please contact your pharmacy*  Follow-Up: Call back or seek an in-person evaluation if the symptoms worsen or if the condition fails to improve as anticipated.  [facility] [phone]  Other Instructions Follow up at ER with worsening symptoms.    If you have been instructed to have an in-person evaluation today at a local Urgent Care facility, please use the link below. It will take you to a list of all of our available [facility], including address,  phone number and hours of operation. Please do not delay care.  [facility]  If you or a family member do not have a primary care provider, use the link below to schedule a visit and establish care. When you choose a [facility] primary care physician or advanced practice provider, you gain a long-term partner in health. Find a Primary Care Provider  Learn more about [facility]'s in-office and virtual care options: [facility] Get Care Now

## 2023-12-03 LAB — AMB RESULTS CONSOLE CBG: Glucose: 115

## 2023-12-05 ENCOUNTER — Ambulatory Visit
Admission: EM | Admit: 2023-12-05 | Discharge: 2023-12-05 | Disposition: A | Discharge status: Home / Self Care | Payer: 59 | Attending: Family Medicine | Admitting: Family Medicine

## 2023-12-05 DIAGNOSIS — R030 Elevated blood-pressure reading, without diagnosis of hypertension: Secondary | ICD-10-CM | POA: Diagnosis not present

## 2023-12-05 NOTE — Discharge Instructions (Signed)
 Keep a blood pressure log by checking your blood pressure twice daily for the next couple weeks and take this to your PCP appointment that is scheduled next month.  Try to avoid salt and reduce your stress levels.

## 2023-12-05 NOTE — Progress Notes (Signed)
 Patient screened for HTN and non fasting random blood glucose. Patient diastolic pressure was found to be elevated after checking it twice. She stated she does not check her BP on a regular basis. Educated her on the risk of uncontrolled HTN and the importance of following up with her PCP. Educational material given on high blood pressure and heart healthy life style. She agreed to see her PCP asap.

## 2023-12-05 NOTE — ED Provider Notes (Signed)
 [facility]    CSN: [id number] Arrival date & time: 12/05/23  1506      History   Chief Complaint Chief Complaint  Patient presents with   Hypertension    HPI Nguyen T Leverette is a 53 y.o. female presents for concern of her blood pressure.  Patient was at a community center event over the weekend where they were checking blood pressures and blood glucose levels.  States she has hers checked twice and readings were 132/97 and 126/98.  She was advised to have this further evaluated by her primary.  She denies any history of hypertension. She denies CP, SOB, dizziness, visual changes, neck pain, or syncope. She has started taking her BP at home and it running 130's over 90's. She does endorse increase in salt by eating out more.  She does have an appoint with her PCP next month.  No other concerns at this time.  Hypertension    History reviewed. No pertinent past medical history.  There are no active problems to display for this patient.   Past Surgical History:  Procedure Laterality Date   CEREBRAL ANEURYSM REPAIR      OB History   No obstetric history on file.      Home Medications    Prior to Admission medications   Medication Sig Start Date End Date Taking? Authorizing Provider  amoxicillin (AMOXIL) 500 MG capsule Take 500 mg by mouth every 12 (twelve) hours. 08/08/23  Yes [provider]  chlorhexidine (PERIDEX) 0.12 % solution Use as directed 15 mLs in the mouth or throat. 08/08/23  Yes [provider]  ibuprofen (ADVIL) 800 MG tablet Take 800 mg by mouth 2 (two) times daily as needed. 08/08/23  Yes [provider]  Magnesium 100 MG CAPS Take by mouth.   Yes [provider]  metroNIDAZOLE (FLAGYL) 500 MG tablet Take 500 mg by mouth 2 (two) times daily. 08/08/23  Yes [provider]  benzonatate (TESSALON) 100 MG capsule Take 1 capsule (100 mg total) by mouth every 8 (eight) hours. 10/30/17   Caccavale, Sophia,  PA-C  fluticasone (FLONASE) 50 MCG/ACT nasal spray Place 2 sprays into both nostrils daily. 10/30/17   Caccavale, Sophia, PA-C  promethazine (PHENERGAN) 25 MG tablet Take 1 tablet (25 mg total) by mouth every 8 (eight) hours as needed for nausea or vomiting. 10/30/17   Caccavale, Sophia, PA-C  promethazine-dextromethorphan (PROMETHAZINE-DM) 6.25-15 MG/5ML syrup Take 5 mLs by mouth 4 (four) times daily as needed for cough. 10/30/17   Caccavale, Sophia, PA-C    Family History Family History  Problem Relation Age of Onset   Cervical cancer Mother    Hypertension Mother    COPD Father    Diabetes Other    Hypertension Other    Alzheimer's disease Maternal Grandmother    Lung cancer Paternal Grandmother     Social History Social History   Tobacco Use   Smoking status: Former    Types: Cigarettes    Passive exposure: Never   Smokeless tobacco: Never  Vaping Use   Vaping status: Never Used  Substance Use Topics   Alcohol use: Yes    Comment: Rarely.   Drug use: Never     Allergies   Codeine   Review of Systems Review of Systems  Cardiovascular:        Elevated BP     Physical Exam Triage Vital Signs ED Triage Vitals  Encounter Vitals Group     BP 12/05/23 1610 130/76  Systolic BP Percentile --      Diastolic BP Percentile --      Pulse Rate 12/05/23 1610 68     Resp 12/05/23 1610 18     Temp 12/05/23 1610 (!) 97.4 F (36.3 C)     Temp Source 12/05/23 1609 Oral     SpO2 12/05/23 1610 98 %     Weight 12/05/23 1605 145 lb (65.8 kg)     Height 12/05/23 1605 5' (1.524 m)     Head Circumference --      Peak Flow --      Pain Score 12/05/23 1600 0     Pain Loc --      Pain Education --      Exclude from Growth Chart --    No data found.  Updated Vital Signs BP 130/76 (BP Location: Right Arm)   Pulse 68   Temp (!) 97.4 F (36.3 C) (Oral)   Resp 18   Ht 5' (1.524 m)   Wt 145 lb (65.8 kg)   LMP 11/11/2022 (Approximate)   SpO2 98%   BMI 28.32 kg/m    Visual Acuity Right Eye Distance:   Left Eye Distance:   Bilateral Distance:    Right Eye Near:   Left Eye Near:    Bilateral Near:     Physical Exam Vitals and nursing note reviewed.  Constitutional:      General: She is not in acute distress.    Appearance: Normal appearance. She is not ill-appearing.  HENT:     Head: Normocephalic and atraumatic.  Eyes:     Pupils: Pupils are equal, round, and reactive to light.  Cardiovascular:     Rate and Rhythm: Normal rate and regular rhythm.     Heart sounds: Normal heart sounds.  Pulmonary:     Effort: Pulmonary effort is normal.     Breath sounds: Normal breath sounds.  Skin:    General: Skin is warm and dry.  Neurological:     General: No focal deficit present.     Mental Status: She is alert and oriented to person, place, and time.  Psychiatric:        Mood and Affect: Mood normal.        Behavior: Behavior normal.      UC Treatments / Results  Labs (all labs ordered are listed, but only abnormal results are displayed) Labs Reviewed - No data to display  EKG   Radiology No results found.  Procedures Procedures (including critical care time)  Medications Ordered in UC Medications - No data to display  Initial Impression / Assessment and Plan / UC Course  I have reviewed the triage vital signs and the nursing notes.  Pertinent labs & imaging results that were available during my care of the patient were reviewed by me and considered in my medical decision making (see chart for details).     Reviewed concerns and exam with patient.  BP reading today 130/76.  Do not feel any treatment needs to be initiated at this time.  Advised her to continue to keep a BP log checking it twice daily and take it to her PCP appointment next month.  Also discussed DASH diet/salt reduction and stress reduction.  She will follow-up with her PCP next month at her scheduled appointment.  ER precautions reviewed and patient  verbalized understanding. Final Clinical Impressions(s) / UC Diagnoses   Final diagnoses:  Elevated BP without diagnosis of hypertension  Discharge Instructions      Keep a blood pressure log by checking your blood pressure twice daily for the next couple weeks and take this to your PCP appointment that is scheduled next month.  Try to avoid salt and reduce your stress levels.    ED Prescriptions   None    PDMP not reviewed this encounter.   Radford Pax, NP 12/05/23 [phone]

## 2023-12-05 NOTE — ED Triage Notes (Signed)
"  I was at a [employer] on Saturday, I had my BP and it was high".   See Documentation below from [employer] worker:  Patient screened for HTN and non fasting random blood glucose. Patient diastolic pressure was found to be elevated after checking it twice. She stated she does not check her BP on a regular basis. Educated her on the risk of uncontrolled HTN and the importance of following up with her PCP. Educational material given on high blood pressure and heart healthy life style. She agreed to see her PCP asap.   Vital signs:  12/03/23 1228 132/97 Abnormal  AH 12/05/23 1019 Current  12/03/23 0018 126/98 Abnormal  AH 12/05/23 1018    Per Leana Gamer, CMA

## 2023-12-30 ENCOUNTER — Encounter: Payer: 59 | Admitting: Family

## 2023-12-30 NOTE — Progress Notes (Signed)
 Erroneous encounter-disregard
